# Patient Record
Sex: Male | Born: 2005 | Race: White | Hispanic: No | Marital: Single | Smoking: Never smoker
Health system: Southern US, Community
[De-identification: ages and names within clinical notes are randomized; demographics above are authoritative.]

## PROBLEM LIST (undated history)

## (undated) DIAGNOSIS — J310 Chronic rhinitis: Secondary | ICD-10-CM

## (undated) DIAGNOSIS — R6251 Failure to thrive (child): Secondary | ICD-10-CM

## (undated) DIAGNOSIS — J45909 Unspecified asthma, uncomplicated: Secondary | ICD-10-CM

## (undated) DIAGNOSIS — K219 Gastro-esophageal reflux disease without esophagitis: Secondary | ICD-10-CM

## (undated) HISTORY — DX: Chronic rhinitis: J31.0

---
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
## 2023-12-21 ENCOUNTER — Encounter: Payer: Self-pay | Admitting: *Deleted
